# Patient Record
Sex: Male | Born: 1995 | Race: Black or African American | Hispanic: No | Marital: Single | Smoking: Never smoker
Health system: Southern US, Community
[De-identification: ages and names within clinical notes are randomized; demographics above are authoritative.]

## PROBLEM LIST (undated history)

## (undated) ENCOUNTER — Emergency Department (HOSPITAL_COMMUNITY): Admission: EM | Payer: 59 | Source: Home / Self Care

## (undated) DIAGNOSIS — R945 Abnormal results of liver function studies: Secondary | ICD-10-CM

## (undated) DIAGNOSIS — D509 Iron deficiency anemia, unspecified: Secondary | ICD-10-CM

## (undated) DIAGNOSIS — R7989 Other specified abnormal findings of blood chemistry: Secondary | ICD-10-CM

## (undated) DIAGNOSIS — R634 Abnormal weight loss: Secondary | ICD-10-CM

## (undated) HISTORY — DX: Iron deficiency anemia, unspecified: D50.9

## (undated) HISTORY — DX: Other specified abnormal findings of blood chemistry: R79.89

## (undated) HISTORY — DX: Abnormal results of liver function studies: R94.5

## (undated) HISTORY — DX: Abnormal weight loss: R63.4

## (undated) HISTORY — PX: TONSILLECTOMY: SUR1361

---
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
## 2014-07-12 ENCOUNTER — Emergency Department (HOSPITAL_COMMUNITY): Payer: BLUE CROSS/BLUE SHIELD

## 2014-07-12 ENCOUNTER — Emergency Department (HOSPITAL_COMMUNITY)
Admission: EM | Admit: 2014-07-12 | Discharge: 2014-07-12 | Disposition: A | Discharge status: Home / Self Care | Payer: BLUE CROSS/BLUE SHIELD | Attending: Emergency Medicine | Admitting: Emergency Medicine

## 2014-07-12 ENCOUNTER — Encounter (HOSPITAL_COMMUNITY): Payer: Self-pay | Admitting: Emergency Medicine

## 2014-07-12 DIAGNOSIS — D509 Iron deficiency anemia, unspecified: Secondary | ICD-10-CM | POA: Insufficient documentation

## 2014-07-12 DIAGNOSIS — Y998 Other external cause status: Secondary | ICD-10-CM | POA: Diagnosis not present

## 2014-07-12 DIAGNOSIS — Z79899 Other long term (current) drug therapy: Secondary | ICD-10-CM | POA: Diagnosis not present

## 2014-07-12 DIAGNOSIS — R52 Pain, unspecified: Secondary | ICD-10-CM

## 2014-07-12 DIAGNOSIS — W19XXXA Unspecified fall, initial encounter: Secondary | ICD-10-CM

## 2014-07-12 DIAGNOSIS — Y9289 Other specified places as the place of occurrence of the external cause: Secondary | ICD-10-CM | POA: Insufficient documentation

## 2014-07-12 DIAGNOSIS — S59902A Unspecified injury of left elbow, initial encounter: Secondary | ICD-10-CM | POA: Insufficient documentation

## 2014-07-12 DIAGNOSIS — M25522 Pain in left elbow: Secondary | ICD-10-CM

## 2014-07-12 DIAGNOSIS — W102XXA Fall (on)(from) incline, initial encounter: Secondary | ICD-10-CM

## 2014-07-12 DIAGNOSIS — W108XXA Fall (on) (from) other stairs and steps, initial encounter: Secondary | ICD-10-CM | POA: Insufficient documentation

## 2014-07-12 DIAGNOSIS — Y9389 Activity, other specified: Secondary | ICD-10-CM | POA: Diagnosis not present

## 2014-07-12 DIAGNOSIS — T1490XA Injury, unspecified, initial encounter: Secondary | ICD-10-CM

## 2014-07-12 MED ORDER — NAPROXEN 500 MG PO TABS: 500.0000 mg | ORAL_TABLET | Freq: Two times a day (BID) | ORAL | Status: DC

## 2014-07-12 NOTE — ED Notes (Signed)
Pt. slipped and fell on his left side this evening , reports pain at left elbow , skin intact / no deformity.

## 2014-07-12 NOTE — Discharge Instructions (Signed)
Take the prescribed medication as directed. °Follow-up with your primary care physician if problems occur. °Return to the ED for new or worsening symptoms. ° °

## 2014-07-12 NOTE — ED Provider Notes (Signed)
CSN: [id number]     Arrival date & time 07/12/14  1926 History  This chart was scribed for non-physician practitioner, Kathryne Hitch, working with Ernestina Patches, MD, by Jeanell Sparrow, ED Scribe. This patient was seen in room TR09C/TR09C and the patient's care was started at 8:05 PM.   Chief Complaint  Patient presents with  . Elbow Injury   The history is provided by the patient. No language interpreter was used.   HPI Comments: Timothy Lamb is a 19 y.o. male who presents to the Emergency Department complaining of a left elbow injury that occurred today. He reports that he fell down some stairs outside and landed on his left elbow. He denies any LOC or head injury. He states that he as some constant moderate left elbow pain.  No numbness/paresthesias/weakness.  No intervention tried PTA.  Past Medical History  Diagnosis Date  . Iron deficiency anemia   . Elevated LFTs   . Weight loss    Past Surgical History  Procedure Laterality Date  . Tonsillectomy     Family History  Problem Relation Age of Onset  . Prostate cancer Maternal Grandfather    History  Substance Use Topics  . Smoking status: Never Smoker   . Smokeless tobacco: Never Used  . Alcohol Use: No    Review of Systems  Musculoskeletal: Positive for myalgias.  Neurological: Negative for syncope.  All other systems reviewed and are negative.   Allergies  Review of patient's allergies indicates no known allergies.  Home Medications   Prior to Admission medications   Medication Sig Start Date End Date Taking? Authorizing Provider  ferrous sulfate 325 (65 FE) MG tablet Take 1 tablet (325 mg total) by mouth daily with breakfast. 05/13/13   Irene Shipper, MD   BP 128/70 mmHg  Pulse 107  Temp(Src) 98.3 F (36.8 C) (Oral)  Resp 18  Ht 6' 2.5" (1.892 m)  SpO2 97% Physical Exam  Constitutional: He is oriented to person, place, and time. He appears well-developed and well-nourished.  HENT:  Head:  Normocephalic and atraumatic.  Mouth/Throat: Oropharynx is clear and moist.  Eyes: Conjunctivae and EOM are normal. Pupils are equal, round, and reactive to light.  Neck: Normal range of motion.  Cardiovascular: Normal rate, regular rhythm and normal heart sounds.   Pulmonary/Chest: Effort normal and breath sounds normal. No respiratory distress. He has no wheezes.  Musculoskeletal: Normal range of motion.       Left elbow: He exhibits normal range of motion, no swelling, no effusion and no deformity. Tenderness found. Olecranon process tenderness noted.  Neurological: He is alert and oriented to person, place, and time.  Skin: Skin is warm and dry.  Psychiatric: He has a normal mood and affect.  Nursing note and vitals reviewed.   ED Course  Procedures (including critical care time) DIAGNOSTIC STUDIES: Oxygen Saturation is 97% on RA, normal by my interpretation.    COORDINATION OF CARE: 8:09 PM- Pt advised of plan for treatment which includes radiology and pt agrees.  Labs Review Labs Reviewed - No data to display  Imaging Review Dg Elbow Complete Left  07/12/2014   CLINICAL DATA:  Pt fell down a flight of stairs and hit his elbow. Reports pain on the dorsal surface of the elbow.  EXAM: LEFT ELBOW - COMPLETE 3+ VIEW  COMPARISON:  None.  FINDINGS: There is no evidence of fracture, dislocation, or joint effusion. There is no evidence of arthropathy or other focal bone  abnormality. Soft tissues are unremarkable.  IMPRESSION: Negative.   Electronically Signed   By: Lajean Manes M.D.   On: 07/12/2014 19:59     EKG Interpretation None      MDM   Final diagnoses:  Fall (on)(from) incline, initial encounter  Left elbow pain   19 y.o. M with fall down stairs, no head injury or LOC.  Complains of left elbow pain-- imaging negative for acute fracture.  Patient d/c home with supportive care, RICE routine, NSAIDs.  FU with PCP.  Discussed plan with patient, he/she acknowledged  understanding and agreed with plan of care.  Return precautions given for new or worsening symptoms.  I personally performed the services described in this documentation, which was scribed in my presence. The recorded information has been reviewed and is accurate.   Larene Pickett, PA-C 07/12/14 2019  Ernestina Patches, MD 07/12/14 2129

## 2015-12-12 ENCOUNTER — Emergency Department (HOSPITAL_COMMUNITY)
Admission: EM | Admit: 2015-12-12 | Discharge: 2015-12-12 | Disposition: A | Discharge status: Home / Self Care | Payer: BLUE CROSS/BLUE SHIELD | Attending: Emergency Medicine | Admitting: Emergency Medicine

## 2015-12-12 ENCOUNTER — Encounter (HOSPITAL_COMMUNITY): Payer: Self-pay | Admitting: Emergency Medicine

## 2015-12-12 DIAGNOSIS — H5789 Other specified disorders of eye and adnexa: Secondary | ICD-10-CM

## 2015-12-12 DIAGNOSIS — Y990 Civilian activity done for income or pay: Secondary | ICD-10-CM | POA: Diagnosis not present

## 2015-12-12 DIAGNOSIS — X58XXXA Exposure to other specified factors, initial encounter: Secondary | ICD-10-CM | POA: Insufficient documentation

## 2015-12-12 DIAGNOSIS — S0592XA Unspecified injury of left eye and orbit, initial encounter: Secondary | ICD-10-CM | POA: Diagnosis present

## 2015-12-12 DIAGNOSIS — Y9269 Other specified industrial and construction area as the place of occurrence of the external cause: Secondary | ICD-10-CM | POA: Insufficient documentation

## 2015-12-12 DIAGNOSIS — Y93H3 Activity, building and construction: Secondary | ICD-10-CM | POA: Insufficient documentation

## 2015-12-12 MED ORDER — FLUORESCEIN SODIUM 1 MG OP STRP
1.0000 | ORAL_STRIP | Freq: Once | OPHTHALMIC | Status: AC
  Administered 2015-12-12: 1 via OPHTHALMIC
  Filled 2015-12-12: qty 1

## 2015-12-12 MED ORDER — POLYMYXIN B-TRIMETHOPRIM 10000-0.1 UNIT/ML-% OP SOLN: 1.0000 [drp] | Freq: Four times a day (QID) | OPHTHALMIC | Status: DC

## 2015-12-12 MED ORDER — TETRACAINE HCL 0.5 % OP SOLN
2.0000 [drp] | Freq: Once | OPHTHALMIC | Status: AC
  Administered 2015-12-12: 2 [drp] via OPHTHALMIC
  Filled 2015-12-12: qty 2

## 2015-12-12 NOTE — ED Notes (Signed)
Medications at bedside for PA to administer

## 2015-12-12 NOTE — ED Notes (Signed)
Visual acuity performed and patient states he does not wear glasses or contacts

## 2015-12-12 NOTE — ED Provider Notes (Signed)
CSN: [id number]     Arrival date & time 12/12/15  [id number] History   First MD Initiated Contact with Patient 12/12/15 0719     Chief Complaint  Patient presents with  . Eye Injury     (Consider location/radiation/quality/duration/timing/severity/associated sxs/prior Treatment) HPI Comments: Timothy Lamb is a 20 y.o. male presents to ED with eye redness and pain. 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 He reports flushing his eye for 20-30 minutes at work site. He was not evaluated in ED or by a PCP. He reports this morning that he woke up with his eye shut and crusted with yellow discharge. Pt. Reports eye burning, FBS in left lateral aspect of eye, photophobia, red eye, and clear mucous discharge, and blurry vision. He denies headache, nausea, or other trauma. No pain with eye movement. No fever, chills, or night sweats. He does not wear glasses or contacts. He was wearing protective glasses yesterday; however, cement fell behind glasses and he denies damage to protective lens.   Patient is a 21 y.o. male presenting with eye injury. The history is provided by the patient.  Eye Injury    Past Medical History  Diagnosis Date  . Iron deficiency anemia   . Elevated LFTs   . Weight loss    Past Surgical History  Procedure Laterality Date  . Tonsillectomy     Family History  Problem Relation Age of Onset  . Prostate cancer Maternal Grandfather    Social History  Substance Use Topics  . Smoking status: Never Smoker   . Smokeless tobacco: Never Used  . Alcohol Use: No    Review of Systems  Eyes: Positive for photophobia, pain, discharge, redness and visual disturbance.  All other systems reviewed and are negative.     Allergies  Review of patient's allergies indicates no known allergies.  Home Medications   Prior to Admission medications   Medication Sig Start Date End Date Taking? Authorizing  Provider  trimethoprim-polymyxin b (POLYTRIM) ophthalmic solution Place 1 drop into the left eye every 6 (six) hours. For 7 days. 12/12/15   Roxanna Mew, PA-C   BP 121/69 mmHg  Pulse 64  Temp(Src) 97.8 F (36.6 C) (Oral)  Resp 16  Ht 6' 3.5" (1.918 m)  Wt 108.863 kg  BMI 29.59 kg/m2  SpO2 98% Physical Exam  Constitutional: He appears well-developed and well-nourished. No distress.  HENT:  Head: Normocephalic and atraumatic.  Eyes: EOM are normal. Pupils are equal, round, and reactive to light. Right eye exhibits no chemosis, no discharge, no exudate and no hordeolum. No foreign body present in the right eye. Left eye exhibits discharge. Left eye exhibits no chemosis, no exudate and no hordeolum. No foreign body present in the left eye. Right conjunctiva is not injected. Right conjunctiva has no hemorrhage. Left conjunctiva is injected. Left conjunctiva has no hemorrhage. No scleral icterus.  Fundoscopic exam:      The left eye shows no AV nicking, no exudate and no hemorrhage. The left eye shows red reflex.  Visual acuity is 20/20 in both eyes individually; however, pt reports slightly blurry in left eye. Clear mucous discharge with eyelash clumping noted from left eye with conjunctival injection, no conjunctival hemorrhage. No foreign bodies observed. PH 8. No corneal abrasion, ulcerations, dendritic lesions, or fluorescein uptake appreciated with wood's lamp examination.   Pulmonary/Chest: No respiratory distress.  Neurological: He is alert.  Skin:  He is not diaphoretic.  Psychiatric: He has a normal mood and affect.    ED Course  Procedures (including critical care time) Labs Review Labs Reviewed - No data to display  Imaging Review No results found. I have personally reviewed and evaluated these images and lab results as part of my medical decision-making.   EKG Interpretation None      MDM   Final diagnoses:  Red eye   Patient is afebrile and non-toxic  appearing. His vital signs are stable. Physical exam remarkable for conjunctival injection, clear mucous discharge. Concern for ocular burn secondary to chemical exposure vs. Corneal abrasion vs. Chemical conjunctivitis. Test pH. Woods lamp.   8:29 AM: pH 8.0. Will irrigate eye. Recheck ph  8:54 AM: pt. Endorses slight improvement in eye burning following irrigation. PH improving. Improvement in burning sensation with tetracaine. No appreciable corneal abrasion/ulceration/fluorescein uptake/dendritic lesions appreciated with woods lamp.   Discussed results with patient. Suspect chemical conjunctivitis. RX ABX drops. Encouraged follow up with ophthalmology if sxs do not improve in next 2-3 days, provided contact. Encourage PCP follow up for re-evaluation in one week. Return precautions provided. Pt voiced understanding and is agreeable.   Roxanna Mew, PA-C 12/13/15 [place], MD 12/15/15 [phone]

## 2015-12-12 NOTE — ED Notes (Signed)
Pt. Stated, I got cement in my left eye yesterday.

## 2015-12-12 NOTE — Discharge Instructions (Signed)
Read the information below.  Use the prescribed medication as directed.  Please discuss all new medications with your pharmacist.   I have prescribed an antibiotic drop. Apply one drop every 6hrs to affected eye for 7 days.  If symptoms do not improve in the next two days, follow up with ophthalmology. I have provided the contact information above.  Any time you are seen in the ED it is important to follow up with your PCP.  You may return to the Emergency Department at any time for worsening condition or any new symptoms that concern you. Return to ED if your symptoms worsen or you develop a fever, headache, or nausea.

## 2016-05-23 ENCOUNTER — Encounter (HOSPITAL_COMMUNITY): Payer: Self-pay | Admitting: Emergency Medicine

## 2016-05-23 ENCOUNTER — Ambulatory Visit (HOSPITAL_COMMUNITY)
Admission: EM | Admit: 2016-05-23 | Discharge: 2016-05-23 | Disposition: A | Discharge status: Home / Self Care | Payer: BLUE CROSS/BLUE SHIELD | Attending: Family Medicine | Admitting: Family Medicine

## 2016-05-23 DIAGNOSIS — R05 Cough: Secondary | ICD-10-CM

## 2016-05-23 DIAGNOSIS — J029 Acute pharyngitis, unspecified: Secondary | ICD-10-CM | POA: Diagnosis not present

## 2016-05-23 DIAGNOSIS — Z79899 Other long term (current) drug therapy: Secondary | ICD-10-CM | POA: Diagnosis not present

## 2016-05-23 DIAGNOSIS — R059 Cough, unspecified: Secondary | ICD-10-CM

## 2016-05-23 LAB — POCT RAPID STREP A: STREPTOCOCCUS, GROUP A SCREEN (DIRECT): NEGATIVE

## 2016-05-23 MED ORDER — BENZONATATE 100 MG PO CAPS: 200.0000 mg | ORAL_CAPSULE | Freq: Three times a day (TID) | ORAL | 0 refills | Status: DC | PRN

## 2016-05-23 MED ORDER — IPRATROPIUM BROMIDE 0.06 % NA SOLN: 2.0000 | Freq: Four times a day (QID) | NASAL | 12 refills | Status: DC

## 2016-05-23 MED ORDER — AZITHROMYCIN 250 MG PO TABS: 250.0000 mg | ORAL_TABLET | Freq: Every day | ORAL | 0 refills | Status: DC

## 2016-05-23 NOTE — ED Triage Notes (Signed)
Sore throat started Thursday, associated with cough.  No one sick at home.

## 2016-05-23 NOTE — ED Provider Notes (Signed)
CSN: [id number]     Arrival date & time 05/23/16  1729 History   None    Chief Complaint  Patient presents with  . Sore Throat   (Consider location/radiation/quality/duration/timing/severity/associated sxs/prior Treatment) Patient c/o cough, sore throat, and uri sx's for 5 days.   The history is provided by the patient.  Sore Throat  This is a new problem. The problem occurs constantly. The problem has not changed since onset.Associated symptoms include headaches. Nothing aggravates the symptoms. Nothing relieves the symptoms. He has tried nothing for the symptoms.    Past Medical History:  Diagnosis Date  . Elevated LFTs   . Iron deficiency anemia   . Weight loss    Past Surgical History:  Procedure Laterality Date  . TONSILLECTOMY     Family History  Problem Relation Age of Onset  . Prostate cancer Maternal Grandfather    Social History  Substance Use Topics  . Smoking status: Never Smoker  . Smokeless tobacco: Never Used  . Alcohol use No    Review of Systems  Constitutional: Positive for fatigue.  HENT: Positive for congestion, rhinorrhea, sneezing and sore throat.   Eyes: Negative.   Respiratory: Negative.   Cardiovascular: Negative.   Gastrointestinal: Negative.   Endocrine: Negative.   Genitourinary: Negative.   Musculoskeletal: Negative.   Skin: Negative.   Allergic/Immunologic: Negative.   Neurological: Positive for headaches.  Hematological: Negative.   Psychiatric/Behavioral: Negative.     Allergies  Patient has no known allergies.  Home Medications   Prior to Admission medications   Medication Sig Start Date End Date Taking? Authorizing Provider  azithromycin (ZITHROMAX) 250 MG tablet Take 1 tablet (250 mg total) by mouth daily. Take first 2 tablets together, then 1 every day until finished. 05/23/16   Lysbeth Penner, FNP  benzonatate (TESSALON) 100 MG capsule Take 2 capsules (200 mg total) by mouth 3 (three) times daily as needed for cough.  05/23/16   Lysbeth Penner, FNP  ipratropium (ATROVENT) 0.06 % nasal spray Place 2 sprays into both nostrils 4 (four) times daily. 05/23/16   Lysbeth Penner, FNP  trimethoprim-polymyxin b (POLYTRIM) ophthalmic solution Place 1 drop into the left eye every 6 (six) hours. For 7 days. Patient not taking: Reported on 05/23/2016 12/12/15   Roxanna Mew, PA-C   Meds Ordered and Administered this Visit  Medications - No data to display  BP 114/78 (BP Location: Left Arm)   Pulse 66   Temp 98.8 F (37.1 C) (Oral)   Resp 18   SpO2 97%  No data found.   Physical Exam  Constitutional: He is oriented to person, place, and time. He appears well-developed and well-nourished.  HENT:  Head: Normocephalic and atraumatic.  Right Ear: External ear normal.  Left Ear: External ear normal.  Mouth/Throat: Oropharynx is clear and moist.  Opx erythematous  Eyes: Conjunctivae are normal. Pupils are equal, round, and reactive to light.  Neck: Normal range of motion. Neck supple.  Cardiovascular: Normal rate, regular rhythm and normal heart sounds.   Pulmonary/Chest: Effort normal and breath sounds normal.  Abdominal: Soft. Bowel sounds are normal.  Musculoskeletal: Normal range of motion.  Neurological: He is alert and oriented to person, place, and time.  Nursing note and vitals reviewed.   Urgent Care Course   Clinical Course     Procedures (including critical care time)  Labs Review Labs Reviewed  POCT RAPID STREP A    Imaging Review No results found.   Visual  Acuity Review  Right Eye Distance:   Left Eye Distance:   Bilateral Distance:    Right Eye Near:   Left Eye Near:    Bilateral Near:         MDM   1. Pharyngitis, unspecified etiology   2. Cough    zpak atrovent nasal spray 2 sprays per nostril qid prn #44ml Tessalon Perles 200mg  one po tid prn #21 Push po fluids, rest, tylenol and motrin otc prn as directed for fever, arthralgias, and myalgias.  Follow up  prn if sx's continue or persist.    Lysbeth Penner, FNP 05/23/16 [phone]

## 2016-05-26 LAB — CULTURE, GROUP A STREP (THRC)

## 2016-10-10 ENCOUNTER — Encounter: Payer: Self-pay | Admitting: Physician Assistant

## 2016-10-10 ENCOUNTER — Ambulatory Visit (INDEPENDENT_AMBULATORY_CARE_PROVIDER_SITE_OTHER): Payer: Commercial Managed Care - HMO | Admitting: Physician Assistant

## 2016-10-10 VITALS — BP 118/84 | HR 71 | Temp 97.9°F | Resp 18 | Ht 74.0 in | Wt 249.2 lb

## 2016-10-10 DIAGNOSIS — Z0000 Encounter for general adult medical examination without abnormal findings: Secondary | ICD-10-CM

## 2016-10-10 DIAGNOSIS — Z7689 Persons encountering health services in other specified circumstances: Secondary | ICD-10-CM | POA: Diagnosis not present

## 2016-10-10 LAB — COMPLETE METABOLIC PANEL WITH GFR
ALT: 31 U/L (ref 9–46)
AST: 22 U/L (ref 10–40)
Albumin: 4.4 g/dL (ref 3.6–5.1)
Alkaline Phosphatase: 73 U/L (ref 40–115)
BUN: 14 mg/dL (ref 7–25)
CALCIUM: 10.6 mg/dL — AB (ref 8.6–10.3)
CHLORIDE: 105 mmol/L (ref 98–110)
CO2: 21 mmol/L (ref 20–31)
CREATININE: 1.16 mg/dL (ref 0.60–1.35)
GFR, Est Non African American: >89 mL/min (ref >=60)
Glucose, Bld: 104 mg/dL — ABNORMAL HIGH (ref 70–99)
POTASSIUM: 4 mmol/L (ref 3.5–5.3)
Sodium: 139 mmol/L (ref 135–146)
Total Bilirubin: 1 mg/dL (ref 0.2–1.2)
Total Protein: 6.8 g/dL (ref 6.1–8.1)

## 2016-10-10 LAB — LIPID PANEL
CHOLESTEROL: 146 mg/dL (ref <200)
HDL: 53 mg/dL (ref >40)
LDL Cholesterol: 79 mg/dL (ref <100)
TRIGLYCERIDES: 71 mg/dL (ref <150)
Total CHOL/HDL Ratio: 2.8 Ratio (ref <5.0)
VLDL: 14 mg/dL (ref <30)

## 2016-10-10 LAB — TSH: TSH: 0.38 mIU/L — ABNORMAL LOW (ref 0.40–4.50)

## 2016-10-10 NOTE — Progress Notes (Signed)
Patient ID: Timothy Lamb MRN: [id number], DOB: August 20, 1995 21 y.o. Date of Encounter: 10/10/2016, 11:08 AM    Chief Complaint: New Patient. Establish Care. Complete Physical.  HPI: 21 y.o. y/o male here as a New Pt to [place].  He reports he has no specific concerns that he needs to address today. He reports that his mom schedule this appointment when she scheduled his siblings appointments to establish care. Says that he mostly just needed to establish a PCP so he would have is available if needed for any acute illness.  He reports that he is just had regular childhood illnesses but no significant medical history.  He reports that he does not smoke. He lives at home with his parents and siblings.   Review of Systems: Consitutional: No fever, chills, fatigue, night sweats, lymphadenopathy, or weight changes. Eyes: No visual changes, eye redness, or discharge. ENT/Mouth: Ears: No otalgia, tinnitus, hearing loss, discharge. Nose: No congestion, rhinorrhea, sinus pain, or epistaxis. Throat: No sore throat, post nasal drip, or teeth pain. Cardiovascular: No CP, palpitations, diaphoresis, DOE, edema, orthopnea, PND. Respiratory: No cough, hemoptysis, SOB, or wheezing. Gastrointestinal: No anorexia, dysphagia, reflux, pain, nausea, vomiting, hematemesis, diarrhea, constipation, BRBPR, or melena. Genitourinary: No dysuria, frequency, urgency, hematuria, incontinence, nocturia, decreased urinary stream, discharge, impotence, or testicular pain/masses. Musculoskeletal: No decreased ROM, myalgias, stiffness, joint swelling, or weakness. Skin: No rash, erythema, lesion changes, pain, warmth, jaundice, or pruritis. Neurological: No headache, dizziness, syncope, seizures, tremors, memory loss, coordination problems, or paresthesias. Psychological: No anxiety, depression, hallucinations, SI/HI. Endocrine: No fatigue, polydipsia, polyphagia, polyuria, or known diabetes. All other  systems were reviewed and are otherwise negative.  Past Medical History:  Diagnosis Date  . Elevated LFTs   . Iron deficiency anemia   . Weight loss      Past Surgical History:  Procedure Laterality Date  . TONSILLECTOMY      Home Meds:  He takes no medications.  Allergies: No Known Allergies  Social History   Social History  . Marital status: Single    Spouse name: N/A  . Number of children: N/A  . Years of education: N/A   Occupational History  . Student    Social History Main Topics  . Smoking status: Never Smoker  . Smokeless tobacco: Never Used  . Alcohol use No  . Drug use: No  . Sexual activity: Yes   Other Topics Concern  . Not on file   Social History Narrative  . No narrative on file    Family History  Problem Relation Age of Onset  . Prostate cancer Maternal Grandfather   Specifically, he reports that there is no premature family history of CAD or CA.  Physical Exam: Blood pressure 118/84, pulse 71, temperature 97.9 F (36.6 C), temperature source Oral, resp. rate 18, height 6\' 2"  (1.88 m), weight 249 lb 3.2 oz (113 kg), SpO2 99 %.  General: Well developed, well nourished,AAM. Appears in no acute distress. HEENT: Normocephalic, atraumatic. Conjunctiva pink, sclera non-icteric. Pupils 2 mm constricting to 1 mm, round, regular, and equally reactive to light and accomodation. EOMI. Internal auditory canal clear. TMs with good cone of light and without pathology. Nasal mucosa pink. Nares are without discharge. No sinus tenderness. Oral mucosa pink.  Neck: Supple. Trachea midline. No thyromegaly. Full ROM. No lymphadenopathy. Lungs: Clear to auscultation bilaterally without wheezes, rales, or rhonchi. Breathing is of normal effort and unlabored. Cardiovascular: RRR with S1 S2. No murmurs, rubs, or gallops. Distal pulses 2+  symmetrically. No carotid or abdominal bruits. Abdomen: Soft, non-tender, non-distended with normoactive bowel sounds. No  hepatosplenomegaly or masses. No rebound/guarding. No CVA tenderness. No hernias. Musculoskeletal: Full range of motion and 5/5 strength throughout.  Skin: Warm and moist without erythema, ecchymosis, wounds, or rash. Neuro: A+Ox3. CN II-XII grossly intact. Moves all extremities spontaneously. Full sensation throughout. Normal gait. Psych:  Responds to questions appropriately with a normal affect.   Assessment/Plan:  21 y.o. y/o  male here for CPE  -1. Encounter for preventive health examination A. Screening Labs: He is fasting. He is agreeable to check screening labs. - CBC with Differential/Platelet - COMPLETE METABOLIC PANEL WITH GFR - Lipid panel - TSH - VITAMIN D 25 Hydroxy (Vit-D Deficiency, Fractures)  B. Immunizations: Flu---------------N/A Tetanus-------This should be up to date---should not require booster until ~ age 29 Pneumococcal-----------N/A    2. Encounter to establish care   Follow-up as needed.  Signed:   [address] [place], [place]  10/10/2016 11:08 AM

## 2016-10-11 LAB — CBC WITH DIFFERENTIAL/PLATELET
BASOS PCT: 0 %
Basophils Absolute: 0 cells/uL (ref 0–200)
Eosinophils Absolute: 186 cells/uL (ref 15–500)
Eosinophils Relative: 6 %
HEMATOCRIT: 48.8 % (ref 38.5–50.0)
Hemoglobin: 15.5 g/dL (ref 13.0–17.0)
LYMPHS ABS: 1054 {cells}/uL (ref 850–3900)
LYMPHS PCT: 34 %
MCH: 25.7 pg — ABNORMAL LOW (ref 27.0–33.0)
MCHC: 31.8 g/dL — ABNORMAL LOW (ref 32.0–36.0)
MCV: 80.9 fL (ref 80.0–100.0)
MONO ABS: 310 {cells}/uL (ref 200–950)
MONOS PCT: 10 %
MPV: 11.1 fL (ref 7.5–12.5)
Neutro Abs: 1550 cells/uL (ref 1500–7800)
Neutrophils Relative %: 50 %
Platelets: 183 10*3/uL (ref 140–400)
RBC: 6.03 MIL/uL — AB (ref 4.20–5.80)
RDW: 15.4 % — AB (ref 11.0–15.0)
WBC: 3.1 10*3/uL — AB (ref 3.8–10.8)

## 2016-10-11 LAB — VITAMIN D 25 HYDROXY (VIT D DEFICIENCY, FRACTURES): Vit D, 25-Hydroxy: 13 ng/mL — ABNORMAL LOW (ref 30–100)

## 2016-10-12 ENCOUNTER — Other Ambulatory Visit: Payer: Self-pay

## 2016-10-12 MED ORDER — VITAMIN D 1000 UNITS PO TABS: 1000.0000 [IU] | ORAL_TABLET | Freq: Every day | ORAL | 3 refills | Status: AC

## 2020-07-20 ENCOUNTER — Other Ambulatory Visit: Payer: Self-pay

## 2020-07-20 ENCOUNTER — Encounter: Payer: Self-pay | Admitting: Emergency Medicine

## 2020-07-20 ENCOUNTER — Ambulatory Visit
Admission: EM | Admit: 2020-07-20 | Discharge: 2020-07-20 | Disposition: A | Discharge status: Home / Self Care | Payer: 59 | Attending: Family Medicine | Admitting: Family Medicine

## 2020-07-20 DIAGNOSIS — J039 Acute tonsillitis, unspecified: Secondary | ICD-10-CM | POA: Diagnosis not present

## 2020-07-20 LAB — POCT RAPID STREP A (OFFICE): Rapid Strep A Screen: NEGATIVE

## 2020-07-20 MED ORDER — AMOXICILLIN 875 MG PO TABS: 875.0000 mg | ORAL_TABLET | Freq: Two times a day (BID) | ORAL | 0 refills | Status: AC

## 2020-07-20 NOTE — ED Provider Notes (Signed)
[facility]   [id number] 07/20/20 Arrival Time: 4034  [id number] THROAT  SUBJECTIVE: History from: patient.  Timothy Lamb is a 25 y.o. male who presents with abrupt onset of sore throat for the last week. Denies sick exposure to Covid, strep, flu or mono, or precipitating event. Has tried ibuprofen and tyenol without relief. Has negative history of Covid. Has not completed Covid vaccines. Symptoms are made worse with swallowing, but tolerating liquids and own secretions without difficulty.  Denies previous symptoms in the past.     Denies fever, chills, fatigue, ear pain, sinus pain, rhinorrhea, nasal congestion, cough, SOB, wheezing, chest pain, nausea, rash, changes in bowel or bladder habits.     ROS: As per HPI.  All other pertinent ROS negative.     Past Medical History:  Diagnosis Date  . Elevated LFTs   . Iron deficiency anemia   . Weight loss    Past Surgical History:  Procedure Laterality Date  . TONSILLECTOMY     No Known Allergies No current facility-administered medications on file prior to encounter.   Current Outpatient Medications on File Prior to Encounter  Medication Sig Dispense Refill  . cholecalciferol (VITAMIN D) 1000 units tablet Take 1 tablet (1,000 Units total) by mouth daily. 30 tablet 3   Social History   Socioeconomic History  . Marital status: Single    Spouse name: Not on file  . Number of children: Not on file  . Years of education: Not on file  . Highest education level: Not on file  Occupational History  . Occupation: Ship broker  Tobacco Use  . Smoking status: Never Smoker  . Smokeless tobacco: Never Used  Substance and Sexual Activity  . Alcohol use: No  . Drug use: No  . Sexual activity: Yes  Other Topics Concern  . Not on file  Social History Narrative  . Not on file   Social Determinants of Health   Financial Resource Strain: Not on file  Food Insecurity: Not on file  Transportation Needs: Not on file   Physical Activity: Not on file  Stress: Not on file  Social Connections: Not on file  Intimate Partner Violence: Not on file   Family History  Problem Relation Age of Onset  . Prostate cancer Maternal Grandfather     OBJECTIVE:  Vitals:   07/20/20 1906  BP: 133/85  Pulse: 83  Resp: 18  Temp: 97.9 F (36.6 C)  TempSrc: Oral  SpO2: 96%  Weight: 265 lb (120.2 kg)  Height: 6\' 2"  (1.88 m)     General appearance: alert; appears fatigued, but nontoxic, speaking in full sentences and managing own secretions HEENT: NCAT; Ears: EACs clear, TMs pearly gray with visible cone of light, without erythema; Eyes: PERRL, EOMI grossly; Nose: no obvious rhinorrhea; Throat: oropharynx erythematous, tonsils surgically absent, uvula midline and erythematous Neck: supple without LAD Lungs: CTA bilaterally without adventitious breath sounds; cough absent Heart: regular rate and rhythm.  Radial pulses 2+ symmetrical bilaterally Skin: warm and dry Psychological: alert and cooperative; normal mood and affect  LABS: Results for orders placed or performed during the hospital encounter of 07/20/20 (from the past 24 hour(s))  POCT rapid strep A     Status: None   Collection Time: 07/20/20  7:10 PM  Result Value Ref Range   Rapid Strep A Screen Negative Negative     ASSESSMENT & PLAN:  1. Acute tonsillitis, unspecified etiology     Meds ordered this encounter  Medications  .  amoxicillin (AMOXIL) 875 MG tablet    Sig: Take 1 tablet (875 mg total) by mouth 2 (two) times daily for 10 days.    Dispense:  20 tablet    Refill:  0    Order Specific Question:   Supervising Provider    Answer:   Chase Picket [id number]    Strep test negative, will send out for culture and we will call you with results Declines test for mono at this time Get plenty of rest and push fluids Take OTC Zyrtec and use chloraseptic spray as needed for throat pain. Drink warm or cool liquids, use throat lozenges, or  popsicles to help alleviate symptoms Take OTC ibuprofen or tylenol as needed for pain Prescribed amoxicillin for tonsillitis Follow up with PCP if symptoms persists Return or go to ER if patient has any new or worsening symptoms such as fever, chills, nausea, vomiting, worsening sore throat, cough, abdominal pain, chest pain, changes in bowel or bladder habits  Reviewed expectations re: course of current medical issues. Questions answered. Outlined signs and symptoms indicating need for more acute intervention. Patient verbalized understanding. After Visit Summary given.          Faustino Congress, NP 07/20/20 1924

## 2020-07-20 NOTE — Discharge Instructions (Signed)
I have sent in amoxicillin for you to take twice a day for 7 days  Your rapid strep test is negative.  A throat culture is pending; we will call you if it is positive requiring treatment.    Follow up with this office or with primary care if symptoms are persisting.  Follow up in the ER for high fever, trouble swallowing, trouble breathing, other concerning symptoms.

## 2020-07-20 NOTE — ED Triage Notes (Signed)
Sore throat x 1 week

## 2020-07-21 LAB — CULTURE, GROUP A STREP (THRC)

## 2020-07-23 LAB — CULTURE, GROUP A STREP (THRC)

## 2021-04-11 ENCOUNTER — Other Ambulatory Visit: Payer: Self-pay

## 2021-04-11 ENCOUNTER — Ambulatory Visit
Admission: EM | Admit: 2021-04-11 | Discharge: 2021-04-11 | Disposition: A | Discharge status: Home / Self Care | Payer: 59 | Attending: Family Medicine | Admitting: Family Medicine

## 2021-04-11 ENCOUNTER — Encounter: Payer: Self-pay | Admitting: Emergency Medicine

## 2021-04-11 DIAGNOSIS — J069 Acute upper respiratory infection, unspecified: Secondary | ICD-10-CM | POA: Diagnosis not present

## 2021-04-11 DIAGNOSIS — R6883 Chills (without fever): Secondary | ICD-10-CM | POA: Diagnosis not present

## 2021-04-11 MED ORDER — ONDANSETRON 4 MG PO TBDP: 4.0000 mg | ORAL_TABLET | Freq: Three times a day (TID) | ORAL | 0 refills | Status: DC | PRN

## 2021-04-11 MED ORDER — PREDNISONE 20 MG PO TABS: 40.0000 mg | ORAL_TABLET | Freq: Every day | ORAL | 0 refills | Status: DC

## 2021-04-11 MED ORDER — OSELTAMIVIR PHOSPHATE 75 MG PO CAPS: 75.0000 mg | ORAL_CAPSULE | Freq: Two times a day (BID) | ORAL | 0 refills | Status: DC

## 2021-04-11 NOTE — ED Provider Notes (Signed)
[facility]    CSN: [id number] Arrival date & time: 04/11/21  1148      History   Chief Complaint Chief Complaint  Patient presents with   URI    HPI Timothy Lamb is a 25 y.o. male.   Patient presenting today with 1 day history of sudden onset fever, chills, body aches, cough, congestion, nausea, vomiting.  Denies chest pain, shortness of breath, significant abdominal pain, bowel changes.  So far not trying anything over-the-counter for symptoms.  No known sick contacts recently.  No known chronic medical problems.   Past Medical History:  Diagnosis Date   Elevated LFTs    Iron deficiency anemia    Weight loss     There are no problems to display for this patient.   Past Surgical History:  Procedure Laterality Date   TONSILLECTOMY         Home Medications    Prior to Admission medications   Medication Sig Start Date End Date Taking? Authorizing Provider  ondansetron (ZOFRAN ODT) 4 MG disintegrating tablet Take 1 tablet (4 mg total) by mouth every 8 (eight) hours as needed for nausea or vomiting. 04/11/21  Yes Volney American, PA-C  oseltamivir (TAMIFLU) 75 MG capsule Take 1 capsule (75 mg total) by mouth every 12 (twelve) hours. 04/11/21  Yes Volney American, PA-C  predniSONE (DELTASONE) 20 MG tablet Take 2 tablets (40 mg total) by mouth daily with breakfast. 04/11/21  Yes Volney American, PA-C  cholecalciferol (VITAMIN D) 1000 units tablet Take 1 tablet (1,000 Units total) by mouth daily. 10/12/16   Orlena Sheldon, PA-C    Family History Family History  Problem Relation Age of Onset   Prostate cancer Maternal Grandfather     Social History Social History   Tobacco Use   Smoking status: Never   Smokeless tobacco: Never  Substance Use Topics   Alcohol use: No   Drug use: No     Allergies   Patient has no known allergies.   Review of Systems Review of Systems Per HPI  Physical Exam Triage Vital Signs ED  Triage Vitals [04/11/21 1345]  Enc Vitals Group     BP 121/81     Pulse Rate 93     Resp 18     Temp 98.8 F (37.1 C)     Temp Source Oral     SpO2 95 %     Weight 265 lb (120.2 kg)     Height      Head Circumference      Peak Flow      Pain Score 0     Pain Loc      Pain Edu?      Excl. in [place]?    No data found.  Updated Vital Signs BP 121/81 (BP Location: Right Arm)   Pulse 93   Temp 98.8 F (37.1 C) (Oral)   Resp 18   Wt 265 lb (120.2 kg)   SpO2 95%   BMI 34.02 kg/m   Visual Acuity Right Eye Distance:   Left Eye Distance:   Bilateral Distance:    Right Eye Near:   Left Eye Near:    Bilateral Near:     Physical Exam Vitals and nursing note reviewed.  Constitutional:      Appearance: Normal appearance.  HENT:     Head: Atraumatic.     Right Ear: Tympanic membrane normal.     Left Ear: Tympanic membrane normal.  Nose: Rhinorrhea present.     Mouth/Throat:     Mouth: Mucous membranes are moist.     Pharynx: Posterior oropharyngeal erythema present.  Eyes:     Extraocular Movements: Extraocular movements intact.     Conjunctiva/sclera: Conjunctivae normal.  Cardiovascular:     Rate and Rhythm: Normal rate and regular rhythm.  Pulmonary:     Effort: Pulmonary effort is normal. No respiratory distress.     Breath sounds: Normal breath sounds. No wheezing or rales.  Abdominal:     General: Bowel sounds are normal. There is no distension.     Palpations: Abdomen is soft.     Tenderness: There is no abdominal tenderness. There is no guarding.  Musculoskeletal:        General: Normal range of motion.     Cervical back: Normal range of motion and neck supple.  Skin:    General: Skin is warm and dry.  Neurological:     General: No focal deficit present.     Mental Status: He is oriented to person, place, and time.  Psychiatric:        Mood and Affect: Mood normal.        Thought Content: Thought content normal.        Judgment: Judgment normal.      UC Treatments / Results  Labs (all labs ordered are listed, but only abnormal results are displayed) Labs Reviewed  COVID-19, FLU A+B AND RSV    EKG   Radiology No results found.  Procedures Procedures (including critical care time)  Medications Ordered in UC Medications - No data to display  Initial Impression / Assessment and Plan / UC Course  I have reviewed the triage vital signs and the nursing notes.  Pertinent labs & imaging results that were available during my care of the patient were reviewed by me and considered in my medical decision making (see chart for details).     Consistent with influenza-like illness.  We will start Tamiflu while awaiting COVID, flu, RSV swab results. We will also start prednisone short burst in addition to Zofran as needed, over-the-counter supportive medications and home care.  Work note given.  Quarantine protocol reviewed.  Return for acutely worsening symptoms.  Final Clinical Impressions(s) / UC Diagnoses   Final diagnoses:  Chills  Viral URI with cough   Discharge Instructions   None    ED Prescriptions     Medication Sig Dispense Auth. Provider   oseltamivir (TAMIFLU) 75 MG capsule Take 1 capsule (75 mg total) by mouth every 12 (twelve) hours. 10 capsule Volney American, PA-C   predniSONE (DELTASONE) 20 MG tablet Take 2 tablets (40 mg total) by mouth daily with breakfast. 6 tablet Volney American, PA-C   ondansetron (ZOFRAN ODT) 4 MG disintegrating tablet Take 1 tablet (4 mg total) by mouth every 8 (eight) hours as needed for nausea or vomiting. 20 tablet Volney American, [place]      PDMP not reviewed this encounter.   Volney American, [place] 04/11/21 [phone]

## 2021-04-11 NOTE — ED Triage Notes (Signed)
Since last night has been having chills and nausea and vomiting.  Low grade fevers.

## 2021-04-12 LAB — COVID-19, FLU A+B AND RSV
Influenza A, NAA: DETECTED — AB
Influenza B, NAA: NOT DETECTED
RSV, NAA: NOT DETECTED
SARS-CoV-2, NAA: NOT DETECTED

## 2021-08-24 ENCOUNTER — Other Ambulatory Visit: Payer: Self-pay

## 2021-08-24 ENCOUNTER — Emergency Department: Payer: PRIVATE HEALTH INSURANCE

## 2021-08-24 ENCOUNTER — Emergency Department
Admission: EM | Admit: 2021-08-24 | Discharge: 2021-08-24 | Disposition: A | Discharge status: Home / Self Care | Payer: PRIVATE HEALTH INSURANCE | Attending: Emergency Medicine | Admitting: Emergency Medicine

## 2021-08-24 ENCOUNTER — Encounter: Payer: Self-pay | Admitting: Emergency Medicine

## 2021-08-24 DIAGNOSIS — W293XXA Contact with powered garden and outdoor hand tools and machinery, initial encounter: Secondary | ICD-10-CM | POA: Diagnosis not present

## 2021-08-24 DIAGNOSIS — Y990 Civilian activity done for income or pay: Secondary | ICD-10-CM | POA: Insufficient documentation

## 2021-08-24 DIAGNOSIS — S81822A Laceration with foreign body, left lower leg, initial encounter: Secondary | ICD-10-CM | POA: Diagnosis not present

## 2021-08-24 DIAGNOSIS — S8992XA Unspecified injury of left lower leg, initial encounter: Secondary | ICD-10-CM | POA: Diagnosis present

## 2021-08-24 DIAGNOSIS — Z23 Encounter for immunization: Secondary | ICD-10-CM | POA: Diagnosis not present

## 2021-08-24 DIAGNOSIS — S81812A Laceration without foreign body, left lower leg, initial encounter: Secondary | ICD-10-CM

## 2021-08-24 MED ORDER — LIDOCAINE HCL (PF) 1 % IJ SOLN
10.0000 mL | Freq: Once | INTRAMUSCULAR | Status: AC
  Administered 2021-08-24: 10 mL via INTRADERMAL
  Filled 2021-08-24: qty 10

## 2021-08-24 MED ORDER — CEPHALEXIN 500 MG PO CAPS: 500.0000 mg | ORAL_CAPSULE | Freq: Three times a day (TID) | ORAL | 0 refills | Status: DC

## 2021-08-24 MED ORDER — TETANUS-DIPHTH-ACELL PERTUSSIS 5-2.5-18.5 LF-MCG/0.5 IM SUSY
0.5000 mL | PREFILLED_SYRINGE | Freq: Once | INTRAMUSCULAR | Status: AC
  Administered 2021-08-24: 0.5 mL via INTRAMUSCULAR
  Filled 2021-08-24: qty 0.5

## 2021-08-24 NOTE — ED Notes (Signed)
Pt provided discharge instructions and prescription information. Pt was given the opportunity to ask questions and questions were answered. Discharge signature not obtained in the setting of the COVID-19 pandemic in order to reduce high touch surfaces.  ° °

## 2021-08-24 NOTE — Discharge Instructions (Signed)
Follow-up with your regular doctor, urgent care, or return emergency department for suture removal.  Keep the areas dry and clean as possible.  After 24 to 48 hours you can allow it to get a little wet in the shower.  However dried off carefully.  Wear a bandage on the area when out in public or outside.  You may allow the area to be open to air when you are in your home after the area has stopped oozing. ?Take the antibiotic as prescribed. ?Tylenol or ibuprofen for pain as needed. ?Return if you are worsening ?

## 2021-08-24 NOTE — ED Triage Notes (Signed)
Cut left leg with chainsaw while at work.  Wound is bandaged. Bleeding controlled.  Patient ambulatory. ?

## 2021-08-24 NOTE — ED Provider Triage Note (Signed)
Emergency Medicine Provider Triage Evaluation Note ? ?Timothy Lamb , a 26 y.o. male  was evaluated in triage.  Pt complains of . ? ?Review of Systems  ?Positive: Laceration left leg, Worker's Comp. ?Negative: Fever chills ? ?Physical Exam  ?BP (!) 132/92 (BP Location: Left Arm)   Pulse 96   Temp 98.4 ?F (36.9 ?C) (Oral)   Resp 16   Ht '6\' 2"'$  (1.88 m)   Wt 120.2 kg   SpO2 100%   BMI 34.02 kg/m?  ?Gen:   Awake, no distress   ?Resp:  Normal effort  ?MSK:   Moves extremities without difficulty  ?Other:   ? ?Medical Decision Making  ?Medically screening exam initiated at 11:05 AM.  Appropriate orders placed.  Timothy Lamb was informed that the remainder of the evaluation will be completed by another provider, this initial triage assessment does not replace that evaluation, and the importance of remaining in the ED until their evaluation is complete. ? ?Large laceration noted, order x-ray and Tdap ?  ?Versie Starks, PA-C ?08/24/21 1106 ? ?

## 2021-08-24 NOTE — ED Notes (Signed)
Pt here with large laceration to the lower left leg from a chainsaw. Bleeding controlled, pt denies pain. ?

## 2021-08-24 NOTE — ED Provider Notes (Signed)
? ?[facility] ?Provider Note ? ? ? Event Date/Time  ? First MD Initiated Contact with Patient 08/24/21 1115   ?  (approximate) ? ? ?History  ? ?Leg Injury ? ? ?HPI ? ?Timothy Lamb is a 26 y.o. male presents emergency department after cutting his leg with a chainsaw.  Patient states he was cutting down a tree when the chainsaw kicked back.  Unsure of his last Tdap.  No difficulty walking. ? ?  ? ? ?Physical Exam  ? ?Triage Vital Signs: ?ED Triage Vitals  ?Enc Vitals Group  ?   BP 08/24/21 1102 (!) 132/92  ?   Pulse Rate 08/24/21 1102 96  ?   Resp 08/24/21 1102 16  ?   Temp 08/24/21 1102 98.4 ?F (36.9 ?C)  ?   Temp Source 08/24/21 1102 Oral  ?   SpO2 08/24/21 1102 100 %  ?   Weight 08/24/21 1101 264 lb 15.9 oz (120.2 kg)  ?   Height 08/24/21 1101 '6\' 2"'$  (1.88 m)  ?   Head Circumference --   ?   Peak Flow --   ?   Pain Score 08/24/21 1101 0  ?   Pain Loc --   ?   Pain Edu? --   ?   Excl. in [place]? --   ? ? ?Most recent vital signs: ?Vitals:  ? 08/24/21 1102 08/24/21 1236  ?BP: (!) 132/92 111/77  ?Pulse: 96 69  ?Resp: 16 16  ?Temp: 98.4 ?F (36.9 ?C)   ?SpO2: 100% 97%  ? ? ? ?General: Awake, no distress.   ?CV:  Good peripheral perfusion. regular rate and  rhythm ?Resp:  Normal effort.  ?Abd:  No distention.   ?Other:  Large laceration noted at the anterior left tib-fib, active bleeding noted ? ? ?ED Results / Procedures / Treatments  ? ?Labs ?(all labs ordered are listed, but only abnormal results are displayed) ?Labs Reviewed - No data to display ? ? ?EKG ? ? ? ? ?RADIOLOGY  ? ?X-ray left ? ?PROCEDURES: ? ? ?Marland Kitchen.Laceration Repair ? ?Date/Time: 08/24/2021 12:38 PM ?Performed by: Versie Starks, PA-C ?Authorized by: Versie Starks, PA-C  ? ?Consent:  ?  Consent obtained:  Verbal ?  Consent given by:  Patient ?  Risks, benefits, and alternatives were discussed: yes   ?  Risks discussed:  Infection, pain, retained foreign body, tendon damage, poor cosmetic result, need for additional repair, nerve  damage, poor wound healing and vascular damage ?  Alternatives discussed:  No treatment ?Universal protocol:  ?  Procedure explained and questions answered to patient or proxy's satisfaction: yes   ?  Patient identity confirmed:  Verbally with patient ?Anesthesia:  ?  Anesthesia method:  Local infiltration ?  Local anesthetic:  Lidocaine 1% w/o epi ?Laceration details:  ?  Location:  Leg ?  Leg location:  L lower leg ?  Length (cm):  6 ?Exploration:  ?  Hemostasis achieved with:  Direct pressure ?  Imaging obtained: x-ray   ?  Imaging outcome: foreign body not noted   ?  Wound exploration: wound explored through full range of motion   ?  Wound extent: foreign bodies/material   ?  Wound extent: no areolar tissue violation noted, no fascia violation noted, no muscle damage noted, no nerve damage noted, no tendon damage noted, no underlying fracture noted and no vascular damage noted   ?  Foreign bodies/material:  Cloth from pants ?  Contaminated: yes   ?  Treatment:  ?  Area cleansed with:  Povidone-iodine and saline ?  Amount of cleaning:  Extensive ?  Irrigation solution:  Sterile saline ?  Irrigation method:  Pressure wash, syringe and tap ?  Visualized foreign bodies/material removed: yes   ?Skin repair:  ?  Repair method:  Sutures ?  Suture size:  4-0 ?  Suture material:  Prolene ?  Suture technique:  Simple interrupted ?  Number of sutures:  10 ?Approximation:  ?  Approximation:  Close ?Repair type:  ?  Repair type:  Intermediate ?Post-procedure details:  ?  Dressing:  Non-adherent dressing ?  Procedure completion:  Tolerated well, no immediate complications ? ? ?MEDICATIONS ORDERED IN ED: ?Medications  ?Tdap (BOOSTRIX) injection 0.5 mL (0.5 mLs Intramuscular Given 08/24/21 1116)  ?lidocaine (PF) (XYLOCAINE) 1 % injection 10 mL (10 mLs Intradermal Given 08/24/21 1151)  ? ? ? ?IMPRESSION / MDM / ASSESSMENT AND PLAN / ED COURSE  ?I reviewed the triage vital signs and the nursing notes. ?             ?                ? ?Differential diagnosis includes, but is not limited to, laceration, fracture, foreign body ? ?X-ray of the left tib-fib was independently reviewed by me, do not see any foreign body or fracture.  Confirmed by radiology ? ?See procedure note for laceration repair. ? ?Due to the laceration being from a chainsaw and I had to pull several foreign bodies out of the wound to place him on Keflex to prevent infection.  His Tdap was updated here in the ED.  He is to have the sutures removed in 10 to 12 days.  He can do this by following up with his regular doctor, urgent care, or return the emergency department.  Patient is in agreement with treatment plan.  Suture care instructions were discussed.  He was discharged stable condition. ? ? ? ? ?  ? ? ?FINAL CLINICAL IMPRESSION(S) / ED DIAGNOSES  ? ?Final diagnoses:  ?Laceration of left lower extremity, initial encounter  ? ? ? ?Rx / DC Orders  ? ?ED Discharge Orders   ? ?      Ordered  ?  cephALEXin (KEFLEX) 500 MG capsule  3 times daily       ? 08/24/21 1223  ? ?  ?  ? ?  ? ? ? ?Note:  This document was prepared using Dragon voice recognition software and may include unintentional dictation errors. ? ?  ?Versie Starks, PA-C ?08/24/21 1240 ? ?  ?Lavonia Drafts, MD ?08/24/21 1307 ? ?

## 2021-09-07 ENCOUNTER — Ambulatory Visit
Admission: EM | Admit: 2021-09-07 | Discharge: 2021-09-07 | Disposition: A | Discharge status: Home / Self Care | Payer: 59 | Attending: Family Medicine | Admitting: Family Medicine

## 2021-09-07 ENCOUNTER — Encounter: Payer: Self-pay | Admitting: Emergency Medicine

## 2021-09-07 ENCOUNTER — Other Ambulatory Visit: Payer: Self-pay

## 2021-09-07 DIAGNOSIS — Z5189 Encounter for other specified aftercare: Secondary | ICD-10-CM

## 2021-09-07 DIAGNOSIS — Z4802 Encounter for removal of sutures: Secondary | ICD-10-CM

## 2021-09-07 NOTE — ED Notes (Signed)
MD assessed site prior to removal of sutures. Sutures removed. Pt tolerated well.  ?

## 2021-09-07 NOTE — ED Triage Notes (Signed)
Pt presents to have sutures removed from LLE that was placed approximately 14 days ago. Pt denies any pain, fever,chills.  ?

## 2021-09-08 NOTE — ED Provider Notes (Signed)
?  [facility] ? ? ?[phone] ?09/07/21 Arrival Time: 9038 ? ?ASSESSMENT & PLAN: ? ?1. Visit for wound check   ?2. Visit for suture removal   ? ?Wound over LLE has healed. Sutures removed by RN without complication. ?May f/u as needed. ? ? ? Follow-up Information   ? ? [facility].   ?Specialty: Urgent Care ?Why: As needed. ?Contact information: ?[address] [place] [address] ?[phone] ? ?  ?  ? ?  ?  ? ?  ? ? ?Reviewed expectations re: course of current medical issues. Questions answered. ?Outlined signs and symptoms indicating need for more acute intervention. ?Understanding verbalized. ?After Visit Summary given. ? ? ?SUBJECTIVE: ?History from: Patient. ?Timothy Lamb is a 26 y.o. male. Pt presents to have sutures removed from LLE that was placed approximately 14 days ago. Pt denies any pain, fever,chills.  ? ? ? ? ? ?  ?Vanessa Kick, MD ?09/08/21 [phone] ? ?

## 2022-03-08 ENCOUNTER — Encounter: Payer: Self-pay | Admitting: Emergency Medicine

## 2022-03-08 ENCOUNTER — Ambulatory Visit
Admission: EM | Admit: 2022-03-08 | Discharge: 2022-03-08 | Disposition: A | Discharge status: Home / Self Care | Payer: 59 | Attending: Family Medicine | Admitting: Family Medicine

## 2022-03-08 ENCOUNTER — Other Ambulatory Visit: Payer: Self-pay

## 2022-03-08 DIAGNOSIS — R319 Hematuria, unspecified: Secondary | ICD-10-CM

## 2022-03-08 DIAGNOSIS — M545 Low back pain, unspecified: Secondary | ICD-10-CM

## 2022-03-08 LAB — POCT URINALYSIS DIP (MANUAL ENTRY)
Glucose, UA: NEGATIVE mg/dL
Leukocytes, UA: NEGATIVE
Nitrite, UA: NEGATIVE
Protein Ur, POC: 100 mg/dL — AB
Spec Grav, UA: >=1.03 — AB (ref 1.010–1.025)
Urobilinogen, UA: 1 E.U./dL
pH, UA: 5.5 (ref 5.0–8.0)

## 2022-03-08 MED ORDER — TAMSULOSIN HCL 0.4 MG PO CAPS: 0.4000 mg | ORAL_CAPSULE | Freq: Every day | ORAL | 0 refills | Status: AC

## 2022-03-08 MED ORDER — IBUPROFEN 800 MG PO TABS: 800.0000 mg | ORAL_TABLET | Freq: Three times a day (TID) | ORAL | 0 refills | Status: AC | PRN

## 2022-03-08 MED ORDER — ONDANSETRON 4 MG PO TBDP: 4.0000 mg | ORAL_TABLET | Freq: Three times a day (TID) | ORAL | 0 refills | Status: AC | PRN

## 2022-03-08 NOTE — Discharge Instructions (Addendum)
I think you most likely have a kidney stone.  The urinalysis shows blood, no signs of infection.  Please start straining your urine with the strainer we have provided.    Continue Tylenol [phone] mg every 4 to 6 hours as needed for pain.  Ibuprofen 800 g every hours as needed for pain.    Start Zofran 4 mg in your tongue every 8 hours as needed for nausea.    Also begin Flomax at nighttime to help relax the lower urinary muscles and help the kidney stone to pass.    If your symptoms worsen or not improved with this treatment, please seek care here or in the emergency room.

## 2022-03-08 NOTE — ED Provider Notes (Signed)
[facility]    CSN: [id number] Arrival date & time: 03/08/22  0944      History   Chief Complaint Chief Complaint  Patient presents with   Back Pain    HPI Timothy Lamb is a 26 y.o. male.   Patient presents for acute right low back pain that started suddenly when he woke up yesterday.  He denies recent accident, trauma, or injury to his right low back.  Reports currently, the pain is a 4 out of 10 and comes and goes as a sharp pain.  There is no radiation of pain down his legs, to any other part of his body.  He has taken Tylenol for the pain last night and woke up this morning with pain much improved.  He denies any numbness or tingling going down his legs, saddle anesthesia, bowel or bladder incontinence, and fevers.  He endorses nausea and vomiting yesterday, he thinks secondary to pain.  He has also had some increased urinary frequency and urgency with hematuria.  He denies dysuria.  He has been trying to drink plenty of water.   Patient denies any history of kidney stone as well as any significant family history of kidney stone.  Medical history noncontributory.    Past Medical History:  Diagnosis Date   Elevated LFTs    Iron deficiency anemia    Weight loss     There are no problems to display for this patient.   Past Surgical History:  Procedure Laterality Date   TONSILLECTOMY         Home Medications    Prior to Admission medications   Medication Sig Start Date End Date Taking? Authorizing Provider  ibuprofen (ADVIL) 800 MG tablet Take 1 tablet (800 mg total) by mouth every 8 (eight) hours as needed for moderate pain. Take with food to prevent GI upset 03/08/22  Yes Noemi Chapel A, NP  ondansetron (ZOFRAN-ODT) 4 MG disintegrating tablet Take 1 tablet (4 mg total) by mouth every 8 (eight) hours as needed for nausea or vomiting. 03/08/22  Yes Eulogio Bear, NP  tamsulosin (FLOMAX) 0.4 MG CAPS capsule Take 1 capsule (0.4 mg total) by  mouth at bedtime. 03/08/22  Yes Eulogio Bear, NP  cholecalciferol (VITAMIN D) 1000 units tablet Take 1 tablet (1,000 Units total) by mouth daily. 10/12/16   Orlena Sheldon, PA-C    Family History Family History  Problem Relation Age of Onset   Prostate cancer Maternal Grandfather     Social History Social History   Tobacco Use   Smoking status: Never   Smokeless tobacco: Never  Substance Use Topics   Alcohol use: No   Drug use: No     Allergies   Patient has no known allergies.   Review of Systems Review of Systems Per HPI  Physical Exam Triage Vital Signs ED Triage Vitals  Enc Vitals Group     BP 03/08/22 1006 115/77     Pulse Rate 03/08/22 1006 93     Resp 03/08/22 1006 20     Temp 03/08/22 1006 99.3 F (37.4 C)     Temp Source 03/08/22 1006 Oral     SpO2 03/08/22 1006 97 %     Weight --      Height --      Head Circumference --      Peak Flow --      Pain Score 03/08/22 1005 2     Pain Loc --  Pain Edu? --      Excl. in [place]? --    No data found.  Updated Vital Signs BP 115/77 (BP Location: Right Arm)   Pulse 93   Temp 99.3 F (37.4 C) (Oral)   Resp 20   SpO2 97%   Visual Acuity Right Eye Distance:   Left Eye Distance:   Bilateral Distance:    Right Eye Near:   Left Eye Near:    Bilateral Near:     Physical Exam Vitals and nursing note reviewed.  Constitutional:      General: He is not in acute distress.    Appearance: Normal appearance. He is not toxic-appearing.  HENT:     Head: Normocephalic and atraumatic.     Mouth/Throat:     Mouth: Mucous membranes are moist.     Pharynx: Oropharynx is clear.  Cardiovascular:     Rate and Rhythm: Normal rate and regular rhythm.  Pulmonary:     Effort: Pulmonary effort is normal. No respiratory distress.     Breath sounds: Normal breath sounds. No wheezing, rhonchi or rales.  Chest:     Chest wall: No tenderness.  Abdominal:     General: Abdomen is flat. Bowel sounds are normal.  There is no distension.     Palpations: Abdomen is soft. There is no mass.     Tenderness: There is no abdominal tenderness. There is no right CVA tenderness, left CVA tenderness or guarding.  Musculoskeletal:     Right lower leg: No edema.     Left lower leg: No edema.  Skin:    General: Skin is warm and dry.     Capillary Refill: Capillary refill takes less than 2 seconds.     Coloration: Skin is not jaundiced or pale.     Findings: No erythema.  Neurological:     Mental Status: He is alert and oriented to person, place, and time.  Psychiatric:        Behavior: Behavior is cooperative.      UC Treatments / Results  Labs (all labs ordered are listed, but only abnormal results are displayed) Labs Reviewed  POCT URINALYSIS DIP (MANUAL ENTRY) - Abnormal; Notable for the following components:      Result Value   Color, UA brown (*)    Bilirubin, UA small (*)    Ketones, POC UA moderate (40) (*)    Spec Grav, UA >=1.030 (*)    Blood, UA moderate (*)    Protein Ur, POC =100 (*)    All other components within normal limits    EKG   Radiology No results found.  Procedures Procedures (including critical care time)  Medications Ordered in UC Medications - No data to display  Initial Impression / Assessment and Plan / UC Course  I have reviewed the triage vital signs and the nursing notes.  Pertinent labs & imaging results that were available during my care of the patient were reviewed by me and considered in my medical decision making (see chart for details).    Patient is well-appearing, normotensive, afebrile, not tachycardic, not tachypneic, oxygenating well on room air.  Urinalysis today shows moderate blood, elevated specific gravity.  Other abnormal urinalysis results are likely secondary to the blood in the urine.  I am highly suspicious for kidney stone, although he has no CVA tenderness on examination today.  Supportive care discussed including starting alternating  Tylenol with ibuprofen, Zofran as needed for nausea, and Flomax to help relax  lower urinary muscles.  ER precautions and return precautions discussed.  Note given for work.  The patient was given the opportunity to ask questions.  All questions answered to their satisfaction.  The patient is in agreement to this plan.   Final Clinical Impressions(s) / UC Diagnoses   Final diagnoses:  Hematuria, unspecified type  Acute right-sided low back pain without sciatica     Discharge Instructions      I think you most likely have a kidney stone.  The urinalysis shows blood, no signs of infection.  Please start straining your urine with the strainer we have provided.    Continue Tylenol [phone] mg every 4 to 6 hours as needed for pain.  Ibuprofen 800 g every hours as needed for pain.    Start Zofran 4 mg in your tongue every 8 hours as needed for nausea.    Also begin Flomax at nighttime to help relax the lower urinary muscles and help the kidney stone to pass.    If your symptoms worsen or not improved with this treatment, please seek care here or in the emergency room.     ED Prescriptions     Medication Sig Dispense Auth. Provider   ibuprofen (ADVIL) 800 MG tablet Take 1 tablet (800 mg total) by mouth every 8 (eight) hours as needed for moderate pain. Take with food to prevent GI upset 21 tablet Noemi Chapel A, NP   tamsulosin (FLOMAX) 0.4 MG CAPS capsule Take 1 capsule (0.4 mg total) by mouth at bedtime. 30 capsule Noemi Chapel A, NP   ondansetron (ZOFRAN-ODT) 4 MG disintegrating tablet Take 1 tablet (4 mg total) by mouth every 8 (eight) hours as needed for nausea or vomiting. 20 tablet Eulogio Bear, NP      PDMP not reviewed this encounter.   Eulogio Bear, NP 03/08/22 1109

## 2022-03-08 NOTE — ED Triage Notes (Signed)
Pt reports lower right sided back pain, nausea, urinary frequency and intermittent hematuria. Denies fever.
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
